# Patient Record
Sex: Male | Born: 1975 | Marital: Married | Smoking: Former smoker
Health system: Southern US, Community
[De-identification: ages and names within clinical notes are randomized; demographics above are authoritative.]

## PROBLEM LIST (undated history)

## (undated) DIAGNOSIS — J45909 Unspecified asthma, uncomplicated: Secondary | ICD-10-CM

## (undated) DIAGNOSIS — B351 Tinea unguium: Secondary | ICD-10-CM

## (undated) DIAGNOSIS — K319 Disease of stomach and duodenum, unspecified: Secondary | ICD-10-CM

## (undated) DIAGNOSIS — F429 Obsessive-compulsive disorder, unspecified: Secondary | ICD-10-CM

## (undated) DIAGNOSIS — R011 Cardiac murmur, unspecified: Secondary | ICD-10-CM

## (undated) DIAGNOSIS — G43909 Migraine, unspecified, not intractable, without status migrainosus: Secondary | ICD-10-CM

## (undated) HISTORY — DX: Tinea unguium: B35.1

## (undated) HISTORY — PX: OTHER SURGICAL HISTORY: SHX169

## (undated) HISTORY — DX: Obsessive-compulsive disorder, unspecified: F42.9

## (undated) HISTORY — DX: Disease of stomach and duodenum, unspecified: K31.9

## (undated) HISTORY — DX: Migraine, unspecified, not intractable, without status migrainosus: G43.909

## (undated) HISTORY — DX: Unspecified asthma, uncomplicated: J45.909

## (undated) HISTORY — DX: Cardiac murmur, unspecified: R01.1

---
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
## 2016-01-07 DIAGNOSIS — F422 Mixed obsessional thoughts and acts: Secondary | ICD-10-CM | POA: Diagnosis not present

## 2016-12-17 DIAGNOSIS — F458 Other somatoform disorders: Secondary | ICD-10-CM | POA: Diagnosis not present

## 2016-12-17 DIAGNOSIS — R5383 Other fatigue: Secondary | ICD-10-CM | POA: Diagnosis not present

## 2016-12-17 DIAGNOSIS — R002 Palpitations: Secondary | ICD-10-CM | POA: Diagnosis not present

## 2016-12-17 DIAGNOSIS — R413 Other amnesia: Secondary | ICD-10-CM | POA: Diagnosis not present

## 2016-12-25 ENCOUNTER — Other Ambulatory Visit (HOSPITAL_BASED_OUTPATIENT_CLINIC_OR_DEPARTMENT_OTHER): Payer: Self-pay

## 2016-12-25 DIAGNOSIS — G4710 Hypersomnia, unspecified: Secondary | ICD-10-CM

## 2016-12-25 DIAGNOSIS — F458 Other somatoform disorders: Secondary | ICD-10-CM

## 2016-12-25 DIAGNOSIS — R5383 Other fatigue: Secondary | ICD-10-CM

## 2017-01-05 ENCOUNTER — Ambulatory Visit (HOSPITAL_BASED_OUTPATIENT_CLINIC_OR_DEPARTMENT_OTHER): Payer: BLUE CROSS/BLUE SHIELD | Attending: Internal Medicine | Admitting: Internal Medicine

## 2017-01-05 VITALS — Ht 72.0 in | Wt 180.0 lb

## 2017-01-05 DIAGNOSIS — G4719 Other hypersomnia: Secondary | ICD-10-CM | POA: Insufficient documentation

## 2017-01-05 DIAGNOSIS — G4710 Hypersomnia, unspecified: Secondary | ICD-10-CM

## 2017-01-05 DIAGNOSIS — Z79899 Other long term (current) drug therapy: Secondary | ICD-10-CM | POA: Insufficient documentation

## 2017-01-05 DIAGNOSIS — R5383 Other fatigue: Secondary | ICD-10-CM | POA: Diagnosis not present

## 2017-01-05 DIAGNOSIS — G4763 Sleep related bruxism: Secondary | ICD-10-CM | POA: Diagnosis not present

## 2017-01-05 DIAGNOSIS — F458 Other somatoform disorders: Secondary | ICD-10-CM

## 2017-01-16 DIAGNOSIS — G4710 Hypersomnia, unspecified: Secondary | ICD-10-CM | POA: Diagnosis not present

## 2017-01-16 NOTE — Procedures (Signed)
   NAME: Brad Huff DATE OF BIRTH:  Apr 07, 1976 MEDICAL RECORD NUMBER [id number]  LOCATION: [facility]  PHYSICIAN: Benjaman Kindler  DATE OF STUDY: 01/05/2017  SLEEP STUDY TYPE: Nocturnal Polysomnogram               REFERRING PHYSICIAN: Deretha Emory, MD   HEIGHT: 6' (182.9 cm)  WEIGHT: 180 lb (81.6 kg)    Body mass index is 24.41 kg/m.    CLINICAL INFORMATION The patient was referred to the sleep center for evaluation of  bruxism, excessive daytime sleepiness, FH of OSA.   MEDICATIONS Patient self administered medications include: PAXIL. Medications administered during study include No sleep medicine administered.Marland Kitchen  SLEEP STUDY TECHNIQUE A multi-channel overnight Polysomnography study was performed. The channels recorded and monitored were central and occipital EEG, electrooculogram (EOG), submentalis EMG (chin), nasal and oral airflow, thoracic and abdominal wall motion, anterior tibialis EMG, snore microphone, electrocardiogram, and a pulse oximetry.  TECHNICAL COMMENTS Comments added by Technician: None  Comments added by Scorer: N/A  SLEEP ARCHITECTURE The study was initiated at 9:15:46 PM and terminated at 4:45:25 AM. The total recorded time was 449.6 minutes. EEG confirmed total sleep time was 416.7 minutes yielding a sleep efficiency of 92.7%. Sleep onset after lights out was 14.5 minutes with a REM latency of 96.5 minutes. The patient spent 5.76% of the night in stage N1 sleep, 72.16% in stage N2 sleep, 0.00% in stage N3 and 22.08% in REM. Wake after sleep onset (WASO) was 18.5 minutes. The Arousal Index was 15.4/hour.  RESPIRATORY PARAMETERS There were a total of 3 respiratory disturbances out of which 3 were apneas ( 0 obstructive, 0 mixed, 3 central) and 0 hypopneas. The apnea/hypopnea index (AHI) was 0.4 events/hour. The RDI was 0.2/hr. The central sleep apnea index was 0.4 events/hour. The REM AHI was 2.0 events/hour and NREM AHI was 0.0  events/hour. The supine AHI was 0.6 events/hour and the non supine AHI was 0.00 supine during 75.50% of sleep. Respiratory disturbances were associated with oxygen desaturation down to a nadir of 92.00% during sleep. The mean oxygen saturation during the study was 96.56%. The cumulative time under 88% oxygen saturation was 0.0 minutes.  LEG MOVEMENT DATA The total leg movements were with a resulting leg movement index of 5.3/hr . Associated arousal with leg movement index was 0.3/hr.  CARDIAC DATA The underlying cardiac rhythm was most consistent with sinus rhythm. Mean heart rate during sleep was 50.99 bpm. Additional rhythm abnormalities include None.  IMPRESSIONS - There is no evidence of significant sleep disordered breathing. - Few LMs are seen, virtually none with arousals  DIAGNOSIS - Normal study  RECOMMENDATIONS - There is no indication for the treatment of sleep disordered breathing  Benjaman Kindler Diplomate, American Board of Sleep Medicine  ELECTRONICALLY SIGNED ON:  01/16/2017, 9:47 PM [facility] PH: [phone]   FX: [phone] ACCREDITED BY THE AMERICAN ACADEMY OF SLEEP MEDICINE

## 2017-01-22 DIAGNOSIS — F422 Mixed obsessional thoughts and acts: Secondary | ICD-10-CM | POA: Diagnosis not present

## 2017-03-03 ENCOUNTER — Telehealth: Payer: Self-pay | Admitting: Cardiovascular Disease

## 2017-03-03 NOTE — Telephone Encounter (Signed)
Received records from [facility] for appointment on 03/19/17 with Dr Allyson Sabal.  Records put with Dr Hazle Coca schedule for 03/19/17. lp

## 2017-03-19 ENCOUNTER — Encounter (INDEPENDENT_AMBULATORY_CARE_PROVIDER_SITE_OTHER): Payer: Self-pay

## 2017-03-19 ENCOUNTER — Encounter: Payer: Self-pay | Admitting: Cardiovascular Disease

## 2017-03-19 ENCOUNTER — Ambulatory Visit (INDEPENDENT_AMBULATORY_CARE_PROVIDER_SITE_OTHER): Payer: BLUE CROSS/BLUE SHIELD | Admitting: Cardiovascular Disease

## 2017-03-19 DIAGNOSIS — Z8249 Family history of ischemic heart disease and other diseases of the circulatory system: Secondary | ICD-10-CM | POA: Insufficient documentation

## 2017-03-19 NOTE — Patient Instructions (Signed)
Medication Instructions: Your physician recommends that you continue on your current medications as directed. Please refer to the Current Medication list given to you today.  Testing/Procedures: Your physician has requested that you have cardiac CT (Coronary Calcium Score). Cardiac computed tomography (CT) is a painless test that uses an x-ray machine to take clear, detailed pictures of your heart. For further information please visit [email or web address]. Please follow instruction sheet as given.   Follow-Up: Your physician recommends that you schedule a follow-up appointment as needed with Dr. Allyson SabalBerry.

## 2017-03-19 NOTE — Addendum Note (Signed)
Addended by: Chana BodeGREEN, Mischa Brittingham L on: 03/19/2017 04:09 PM   Modules accepted: Orders

## 2017-03-19 NOTE — Assessment & Plan Note (Signed)
Mr. Brad Huff is a 41 year old young healthy-appearing Caucasian male referred by Dr. Nedra HaiLee for cardiovascular evaluation because of a family history of heart disease. He otherwise has no risk factors. He does exercise without limitation. His father apparently died at age 41 of a myocardial infarction. I am going to get coronary artery calcium score to risk stratify him.

## 2017-03-19 NOTE — Progress Notes (Signed)
     03/19/2017 Brad Huff Brad Huff   May 29, 1976  [id number]  Primary Physician Lenell AntuLe, Thao P, DO Primary Cardiologist: Runell GessJonathan J Malaka Ruffner MD Roseanne RenoFACP, FACC, FAHA, FSCAI  HPI:  Brad Huff Brad Huff is a 41 y.o. fit-appearing married Caucasian male father of one child referred by Dr. Conley RollsLe for cardiovascular evaluation because of family history. He has no other cardiac risk factors. He is completely asymptomatic. Never had a heart attack or stroke. He does not smoke. He works as a Production designer, theatre/television/filmmanager at [employer]. Does drink alcohol 3 nights a week.   Current Meds  Medication Sig  . PARoxetine (PAXIL) 20 MG tablet Take 20 mg by mouth daily.     Not on File  Social History   Social History  . Marital status: Married    Spouse name: N/A  . Number of children: N/A  . Years of education: N/A   Occupational History  . Not on file.   Social History Main Topics  . Smoking status: Never Smoker  . Smokeless tobacco: Never Used  . Alcohol use Not on file  . Drug use: Unknown  . Sexual activity: Not on file   Other Topics Concern  . Not on file   Social History Narrative  . No narrative on file     Review of Systems: General: negative for chills, fever, night sweats or weight changes.  Cardiovascular: negative for chest pain, dyspnea on exertion, edema, orthopnea, palpitations, paroxysmal nocturnal dyspnea or shortness of breath Dermatological: negative for rash Respiratory: negative for cough or wheezing Urologic: negative for hematuria Abdominal: negative for nausea, vomiting, diarrhea, bright red blood per rectum, melena, or hematemesis Neurologic: negative for visual changes, syncope, or dizziness All other systems reviewed and are otherwise negative except as noted above.    Blood pressure 116/76, pulse 71, height 6' (1.829 m), weight 176 lb 12.8 oz (80.2 kg).  General appearance: alert and no distress Neck: no adenopathy, no carotid bruit, no JVD, supple, symmetrical, trachea midline and  thyroid not enlarged, symmetric, no tenderness/mass/nodules Lungs: clear to auscultation bilaterally Heart: regular rate and rhythm, S1, S2 normal, no murmur, click, rub or gallop Extremities: extremities normal, atraumatic, no cyanosis or edema Pulses: 2+ and symmetric Skin: Skin color, texture, turgor normal. No rashes or lesions Neurologic: Alert and oriented X 3, normal strength and tone. Normal symmetric reflexes. Normal coordination and gait  EKG sinus rhythm at 71 with incomplete right bundle branch block. I personally reviewed this EKG.  ASSESSMENT AND PLAN:   Family history of heart disease Brad Huff is a 41 year old young healthy-appearing Caucasian male referred by Dr. Nedra HaiLee for cardiovascular evaluation because of a family history of heart disease. He otherwise has no risk factors. He does exercise without limitation. His father apparently died at age 41 of a myocardial infarction. I am going to get coronary artery calcium score to risk stratify him.      Runell GessJonathan J. Alexander Aument MD FACP,FACC,FAHA, [facility] 03/19/2017 3:53 PM

## 2017-03-23 ENCOUNTER — Telehealth: Payer: Self-pay | Admitting: Cardiovascular Disease

## 2017-03-23 NOTE — Telephone Encounter (Signed)
Left message for patient letting him know his Calcium Sc[facility] is [facility]  I also let him know that he will need to pay $150.00 at the time of the visit.

## 2017-03-26 ENCOUNTER — Inpatient Hospital Stay: Admission: RE | Admit: 2017-03-26 | Payer: BLUE CROSS/BLUE SHIELD | Source: Ambulatory Visit

## 2017-04-02 ENCOUNTER — Inpatient Hospital Stay: Admission: RE | Admit: 2017-04-02 | Payer: BLUE CROSS/BLUE SHIELD | Source: Ambulatory Visit

## 2017-05-18 ENCOUNTER — Ambulatory Visit (HOSPITAL_COMMUNITY): Payer: BLUE CROSS/BLUE SHIELD | Admitting: Psychiatry

## 2017-05-18 ENCOUNTER — Encounter (HOSPITAL_COMMUNITY): Payer: Self-pay

## 2017-05-21 DIAGNOSIS — F422 Mixed obsessional thoughts and acts: Secondary | ICD-10-CM | POA: Diagnosis not present

## 2017-05-21 DIAGNOSIS — F410 Panic disorder [episodic paroxysmal anxiety] without agoraphobia: Secondary | ICD-10-CM | POA: Diagnosis not present

## 2017-06-10 ENCOUNTER — Ambulatory Visit (HOSPITAL_COMMUNITY): Payer: Self-pay | Admitting: Licensed Clinical Social Worker

## 2017-07-29 DIAGNOSIS — F422 Mixed obsessional thoughts and acts: Secondary | ICD-10-CM | POA: Diagnosis not present

## 2017-12-10 DIAGNOSIS — F422 Mixed obsessional thoughts and acts: Secondary | ICD-10-CM | POA: Diagnosis not present

## 2018-01-07 DIAGNOSIS — F422 Mixed obsessional thoughts and acts: Secondary | ICD-10-CM | POA: Diagnosis not present

## 2018-02-01 DIAGNOSIS — F422 Mixed obsessional thoughts and acts: Secondary | ICD-10-CM | POA: Diagnosis not present

## 2018-03-15 DIAGNOSIS — F422 Mixed obsessional thoughts and acts: Secondary | ICD-10-CM | POA: Diagnosis not present

## 2018-06-03 DIAGNOSIS — F422 Mixed obsessional thoughts and acts: Secondary | ICD-10-CM | POA: Diagnosis not present

## 2018-07-01 DIAGNOSIS — F422 Mixed obsessional thoughts and acts: Secondary | ICD-10-CM | POA: Diagnosis not present

## 2018-07-22 ENCOUNTER — Ambulatory Visit (INDEPENDENT_AMBULATORY_CARE_PROVIDER_SITE_OTHER): Payer: BLUE CROSS/BLUE SHIELD | Admitting: Cardiovascular Disease

## 2018-07-22 ENCOUNTER — Encounter: Payer: Self-pay | Admitting: Cardiovascular Disease

## 2018-07-22 DIAGNOSIS — R002 Palpitations: Secondary | ICD-10-CM

## 2018-07-22 DIAGNOSIS — Z8249 Family history of ischemic heart disease and other diseases of the circulatory system: Secondary | ICD-10-CM | POA: Diagnosis not present

## 2018-07-22 NOTE — Assessment & Plan Note (Signed)
Brad Huff is noted increasing palpitations of the last several weeks to months that now occur on a daily basis.  He is under a lot of stress at work and drinks a pot of coffee a day.  We are going to get a 2D echo, 2-week ZIO patch and thyroid function test.

## 2018-07-22 NOTE — Assessment & Plan Note (Signed)
I am going to get a coronary calcium score to further evaluate given his family history of heart disease.

## 2018-07-22 NOTE — Patient Instructions (Addendum)
Medication Instructions:  Your physician recommends that you continue on your current medications as directed. Please refer to the Current Medication list given to you today.  If you need a refill on your cardiac medications before your next appointment, please call your pharmacy.   Lab work: Your physician recommends that you return for lab work today: LIPID AND LIVER PANELS; TSH  If you have labs (blood work) drawn today and your tests are completely normal, you will receive your results only by: Marland Kitchen MyChart Message (if you have MyChart) OR . A paper copy in the mail If you have any lab test that is abnormal or we need to change your treatment, we will call you to review the results.  Testing/Procedures: Your physician has requested that you have an echocardiogram. Echocardiography is a painless test that uses sound waves to create images of your heart. It provides your doctor with information about the size and shape of your heart and how well your heart's chambers and valves are working. This procedure takes approximately one hour. There are no restrictions for this procedure. This will be performed at our [facility] location - [address], [address].     Your physician has recommended that you wear a 14 DAY ZIO-PATCH monitor. The Zio patch cardiac monitor continuously records heart rhythm data for up to 14 days, this is for patients being evaluated for multiple types heart rhythms. For the first 24 hours post application, please avoid getting the Zio monitor wet in the shower or by excessive sweating during exercise. After that, feel free to carry on with regular activities. Keep soaps and lotions away from the ZIO XT Patch.  This will be placed at our [facility] location - [address].        Your physician recommends that you have a coronary calcium scan.  Coronary Calcium Scan A coronary calcium scan is an imaging test used to look for deposits of calcium and other  fatty materials (plaques) in the inner lining of the blood vessels of the heart (coronary arteries). These deposits of calcium and plaques can partly clog and narrow the coronary arteries without producing any symptoms or warning signs. This puts a person at risk for a heart attack. This test can detect these deposits before symptoms develop. Tell a health care provider about:  Any allergies you have.  All medicines you are taking, including vitamins, herbs, eye drops, creams, and over-the-counter medicines.  Any problems you or family members have had with anesthetic medicines.  Any blood disorders you have.  Any surgeries you have had.  Any medical conditions you have.  Whether you are pregnant or may be pregnant. What are the risks? Generally, this is a safe procedure. However, problems may occur, including:  Harm to a pregnant woman and her unborn baby. This test involves the use of radiation. Radiation exposure can be dangerous to a pregnant woman and her unborn baby. If you are pregnant, you generally should not have this procedure done.  Slight increase in the risk of cancer. This is because of the radiation involved in the test. What happens before the procedure? No preparation is needed for this procedure. What happens during the procedure?   You will undress and remove any jewelry around your neck or chest.  You will put on a hospital gown.  Sticky electrodes will be placed on your chest. The electrodes will be connected to an electrocardiogram (ECG) machine to record  a tracing of the electrical activity of your heart.  A CT scanner will take pictures of your heart. During this time, you will be asked to lie still and hold your breath for 2-3 seconds while a picture of your heart is being taken. The procedure may vary among health care providers and hospitals. What happens after the procedure?  You can get dressed.  You can return to your normal activities.  It is  up to you to get the results of your test. Ask your health care provider, or the department that is doing the test, when your results will be ready. Summary  A coronary calcium scan is an imaging test used to look for deposits of calcium and other fatty materials (plaques) in the inner lining of the blood vessels of the heart (coronary arteries).  Generally, this is a safe procedure. Tell your health care provider if you are pregnant or may be pregnant.  No preparation is needed for this procedure.  A CT scanner will take pictures of your heart.  You can return to your normal activities after the scan is done. This information is not intended to replace advice given to you by your health care provider. Make sure you discuss any questions you have with your health care provider. Document Released: 11/14/2007 Document Revised: 04/06/2016 Document Reviewed: 04/06/2016 Elsevier Interactive Patient Education  2019 [employer].   Follow-Up: At [facility], you and your health needs are our priority.  As part of our continuing mission to provide you with exceptional heart care, we have created designated Provider Care Teams.  These Care Teams include your primary Cardiologist (physician) and Advanced Practice Providers (APPs -  Physician Assistants and Nurse Practitioners) who all work together to provide you with the care you need, when you need it. . You will need a follow up appointment in 3 months. You may see Dr. Allyson Sabal or one of the following Advanced Practice Providers on your designated Care Team:   . Corine Shelter, [place] . Azalee Course, PA-C . Micah Flesher, PA-C . Joni Reining, DNP . Theodore Demark, PA-C . Judy Pimple, PA-C . Marjie Skiff, PA-C

## 2018-07-22 NOTE — Progress Notes (Signed)
07/22/2018 Brad Huff   1975/09/01  [id number]  Primary Physician Brad Antu, DO Primary Cardiologist: Brad Gess MD Brad Huff  HPI:  Brad Huff is a 43 y.o.  fit-appearing married Caucasian male father of one child referred by Brad Huff for cardiovascular evaluation because of family history. He has no other cardiac risk factors. He is completely asymptomatic. Never had a heart attack or stroke. He does not smoke. 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  Does drink alcohol 3 nights a week.  Since I saw him in the office a year and a half ago he is noticed significant increase stress in his life is a relates to his business.  This was coincident with increase in his palpitations. He also drinks a pot of coffee a day.  He denies chest pain or shortness of breath.    Current Meds  Medication Sig  . PARoxetine (PAXIL) 40 MG tablet Take 40 mg by mouth every morning.     Not on File  Social History   Socioeconomic History  . Marital status: Married    Spouse name: Not on file  . Number of children: Not on file  . Years of education: Not on file  . Highest education level: Not on file  Occupational History  . Not on file  Social Needs  . Financial resource strain: Not on file  . Food insecurity:    Worry: Not on file    Inability: Not on file  . Transportation needs:    Medical: Not on file    Non-medical: Not on file  Tobacco Use  . Smoking status: Never Smoker  . Smokeless tobacco: Never Used  Substance and Sexual Activity  . Alcohol use: Not on file  . Drug use: Not on file  . Sexual activity: Not on file  Lifestyle  . Physical activity:    Days per week: Not on file    Minutes per session: Not on file  . Stress: Not on file  Relationships  . Social connections:    Talks on phone: Not on file    Gets together: Not on file    Attends religious service: Not on file    Active member  of club or organization: Not on file    Attends meetings of clubs or organizations: Not on file    Relationship status: Not on file  . Intimate partner violence:    Fear of current or ex partner: Not on file    Emotionally abused: Not on file    Physically abused: Not on file    Forced sexual activity: Not on file  Other Topics Concern  . Not on file  Social History Narrative  . Not on file     Review of Systems: General: negative for chills, fever, night sweats or weight changes.  Cardiovascular: negative for chest pain, dyspnea on exertion, edema, orthopnea, palpitations, paroxysmal nocturnal dyspnea or shortness of breath Dermatological: negative for rash Respiratory: negative for cough or wheezing Urologic: negative for hematuria Abdominal: negative for nausea, vomiting, diarrhea, bright red blood per rectum, melena, or hematemesis Neurologic: negative for visual changes, syncope, or dizziness All other systems reviewed and are otherwise negative except as noted above.    Blood pressure 126/84, pulse 61, height 6' (1.829 m), weight 191 lb (86.6 kg).  General appearance: alert and no distress Neck: no adenopathy, no carotid bruit,  no JVD, supple, symmetrical, trachea midline and thyroid not enlarged, symmetric, no tenderness/mass/nodules Lungs: clear to auscultation bilaterally Heart: regular rate and rhythm, S1, S2 normal, no murmur, click, rub or gallop Extremities: extremities normal, atraumatic, no cyanosis or edema Pulses: 2+ and symmetric Skin: Skin color, texture, turgor normal. No rashes or lesions Neurologic: Alert and oriented X 3, normal strength and tone. Normal symmetric reflexes. Normal coordination and gait  EKG sinus rhythm at 61 with a right bundle branch block.  I personally reviewed this EKG.  ASSESSMENT AND PLAN:   Palpitations Brad Huff is noted increasing palpitations of the last several weeks to months that now occur on a daily basis.  He is under a  lot of stress at work and drinks a pot of coffee a day.  We are going to get a 2D echo, 2-week ZIO patch and thyroid function test.  Family history of heart disease I am going to get a coronary calcium score to further evaluate given his family history of heart disease.      Brad Gess MD FACP,FACC,FAHA, [facility] 07/22/2018 10:55 AM

## 2018-08-03 ENCOUNTER — Encounter: Payer: Self-pay | Admitting: *Deleted

## 2018-08-03 ENCOUNTER — Ambulatory Visit (INDEPENDENT_AMBULATORY_CARE_PROVIDER_SITE_OTHER): Payer: BLUE CROSS/BLUE SHIELD

## 2018-08-03 ENCOUNTER — Ambulatory Visit (HOSPITAL_COMMUNITY): Payer: BLUE CROSS/BLUE SHIELD | Attending: Cardiology

## 2018-08-03 ENCOUNTER — Ambulatory Visit (INDEPENDENT_AMBULATORY_CARE_PROVIDER_SITE_OTHER)
Admission: RE | Admit: 2018-08-03 | Discharge: 2018-08-03 | Disposition: A | Discharge status: Home / Self Care | Payer: Self-pay | Source: Ambulatory Visit | Attending: Cardiovascular Disease | Admitting: Cardiovascular Disease

## 2018-08-03 DIAGNOSIS — R002 Palpitations: Secondary | ICD-10-CM

## 2018-08-03 DIAGNOSIS — Z8249 Family history of ischemic heart disease and other diseases of the circulatory system: Secondary | ICD-10-CM

## 2018-08-03 NOTE — Progress Notes (Signed)
Patient ID: Brad Huff, male   DOB: 31-Dec-1975, 43 y.o.   MRN: [id number] Due to high deductible plan, patient requested quote on out of pocket cost for available 14 day long term holter monitors.  Pre[employer]ice $282.34, ZIO patch $525.00.  Patient opted for lower out of pocket option which will provide the same data.

## 2018-09-18 DIAGNOSIS — R05 Cough: Secondary | ICD-10-CM | POA: Diagnosis not present

## 2018-10-19 ENCOUNTER — Telehealth: Payer: Self-pay | Admitting: Cardiovascular Disease

## 2018-10-21 ENCOUNTER — Telehealth: Payer: Self-pay

## 2018-10-21 ENCOUNTER — Ambulatory Visit: Payer: BLUE CROSS/BLUE SHIELD | Admitting: Cardiovascular Disease

## 2018-10-21 NOTE — Telephone Encounter (Signed)
lmtcb by 0900 to change 1000 office visit to virtual or to reschedule for OV in 3 mos.

## 2018-12-01 NOTE — Telephone Encounter (Signed)
Open n error °

## 2018-12-16 DIAGNOSIS — F422 Mixed obsessional thoughts and acts: Secondary | ICD-10-CM | POA: Diagnosis not present

## 2019-01-20 DIAGNOSIS — Z0000 Encounter for general adult medical examination without abnormal findings: Secondary | ICD-10-CM | POA: Diagnosis not present

## 2019-01-20 DIAGNOSIS — Z13220 Encounter for screening for lipoid disorders: Secondary | ICD-10-CM | POA: Diagnosis not present

## 2019-02-17 DIAGNOSIS — R6882 Decreased libido: Secondary | ICD-10-CM | POA: Diagnosis not present

## 2019-03-21 DIAGNOSIS — Z23 Encounter for immunization: Secondary | ICD-10-CM | POA: Diagnosis not present

## 2019-03-21 DIAGNOSIS — R6882 Decreased libido: Secondary | ICD-10-CM | POA: Diagnosis not present

## 2019-04-13 IMAGING — CT CT HEART SCORING
2 series · 16 of 20 positions shown, 18 images · non-contrast
Comparison: None
COMPARISON: None
COMPARISON: None

Addendum:
EXAM:
OVER-READ INTERPRETATION  CT CHEST

The following report is an over-read performed by radiologist Dr.
Lhaaj Trs [REDACTED] on 08/03/2018. This over-read
does not include interpretation of cardiac or coronary anatomy or
pathology. The coronary calcium score interpretation by the
cardiologist is attached.
CLINICAL DATA: Risk stratification
Coronary Calcium Score
TECHNIQUE: The patient was scanned on a Siemens Somatom 64 slice scanner. Axial
non-contrast 3 mm slices were carried out through the heart. The
data set was analyzed on a dedicated work station and scored using
the Agatson method.

[Series 3: casc 3.0 i36f 2 bestdiast 72 % · axial · 0.33mm/px · z∈[-291,-174]mm · 8 of 51 slices shown, 10 images]
[im 6/51  vessel]
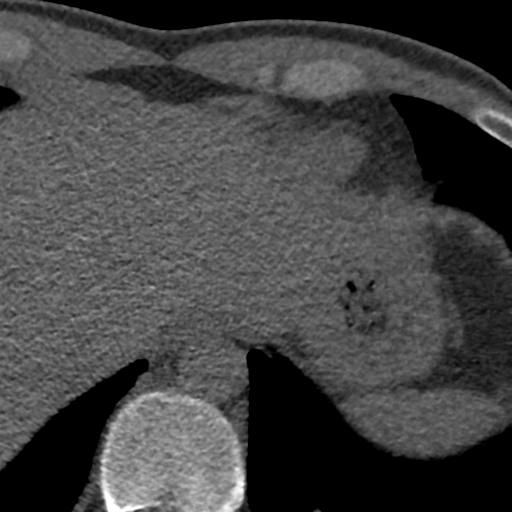
[im 6/51  lung]
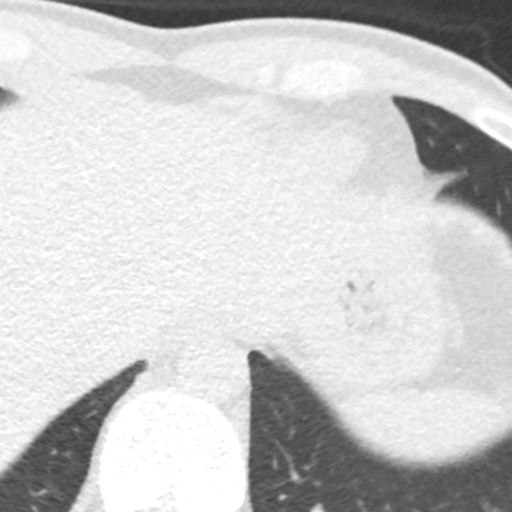
[im 12/51  vessel]
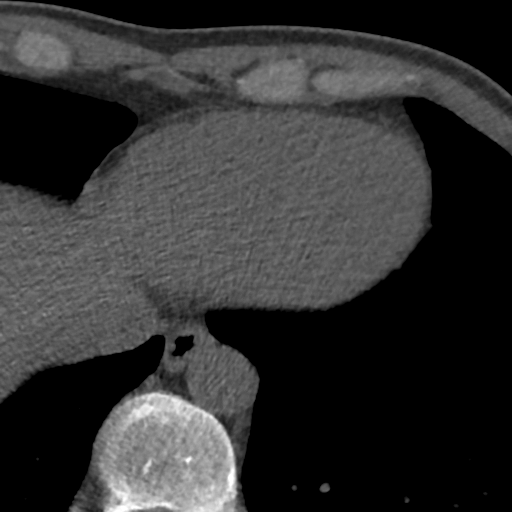
[im 17/51  vessel]
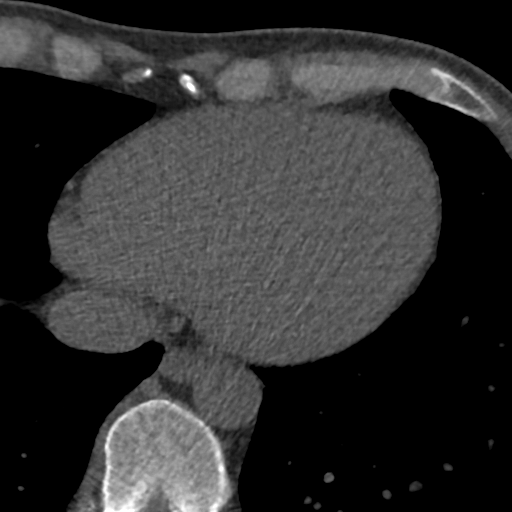
[im 23/51  vessel]
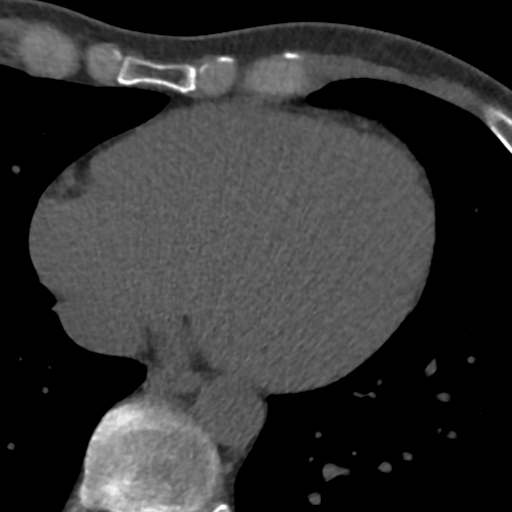
[im 28/51  vessel]
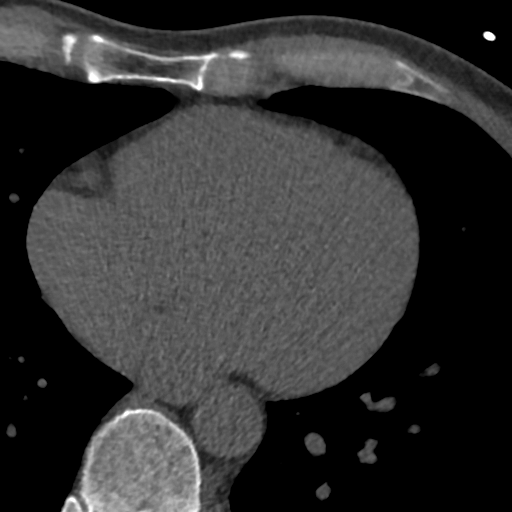
[im 28/51  lung]
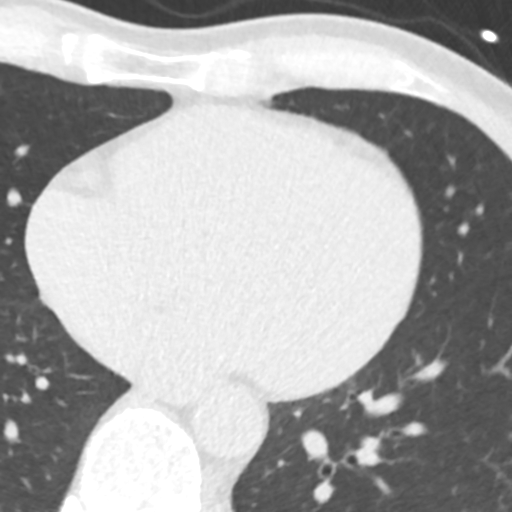
[im 34/51  vessel]
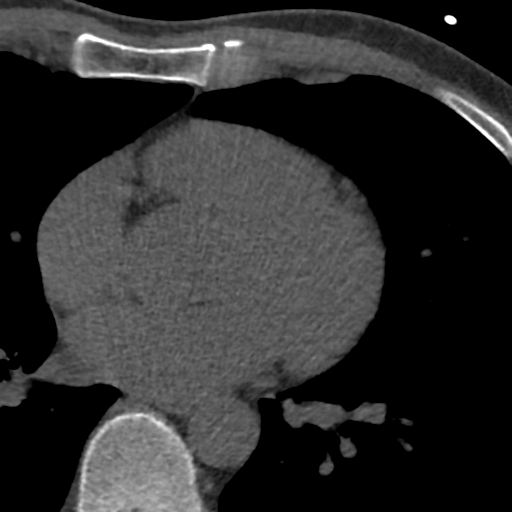
[im 39/51  vessel]
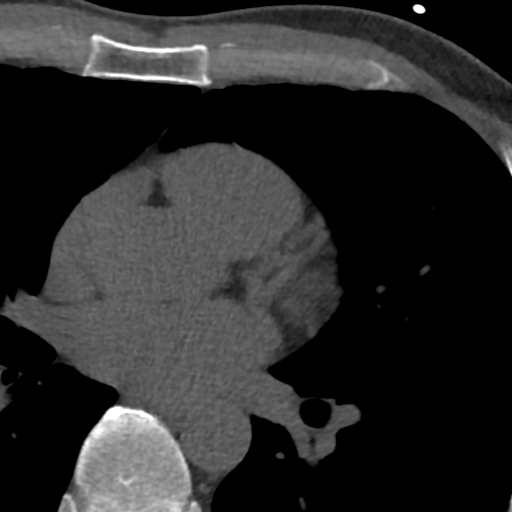
[im 45/51  vessel]
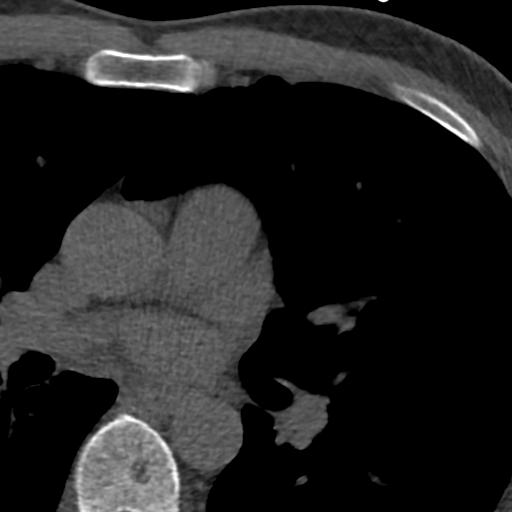

[Series 5: lung st 71 % · axial · 0.68mm/px · z∈[-291,-174]mm · 8 of 51 slices shown]
[im 6/51  lung]
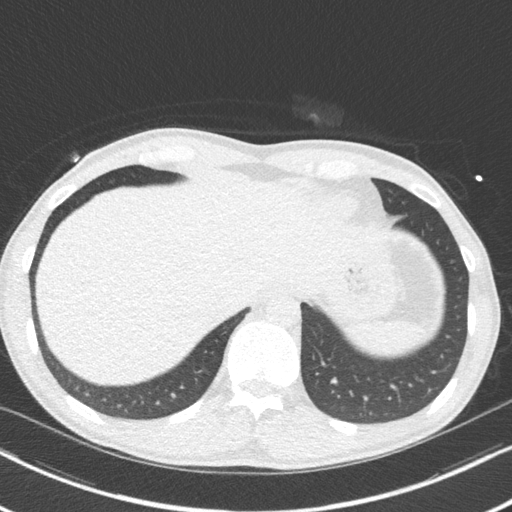
[im 12/51  lung]
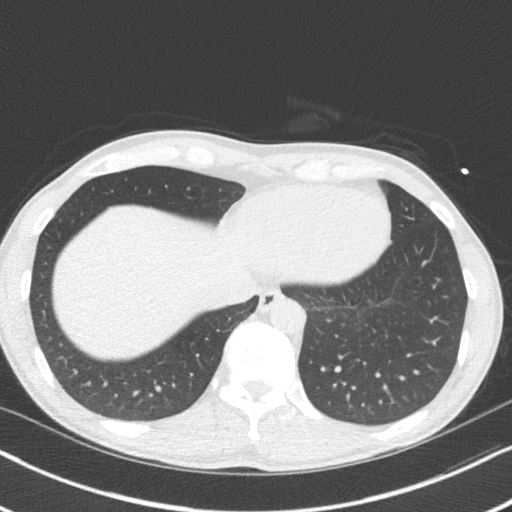
[im 17/51  lung]
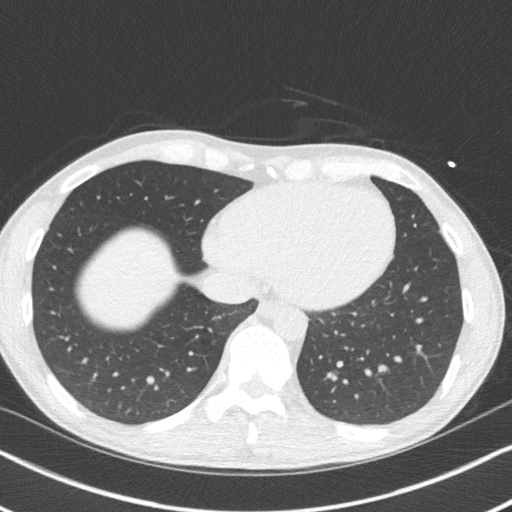
[im 23/51  lung]
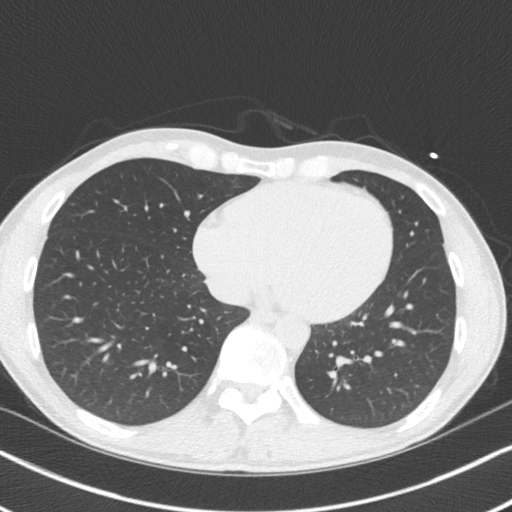
[im 28/51  lung]
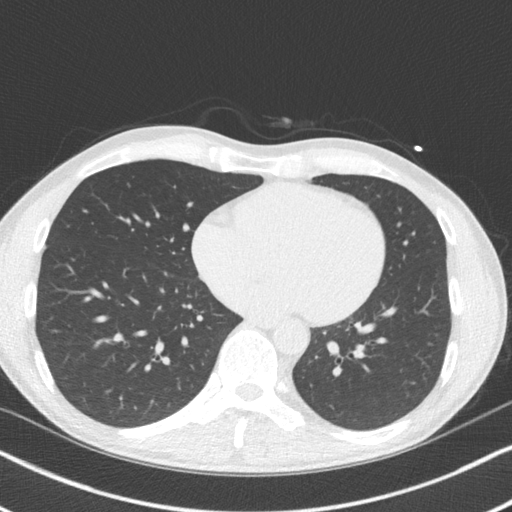
[im 34/51  lung]
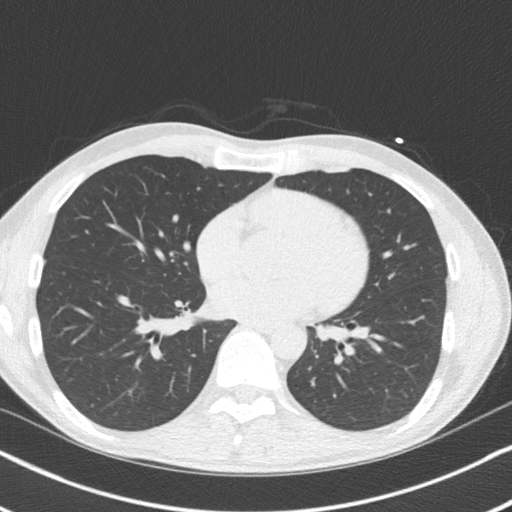
[im 39/51  lung]
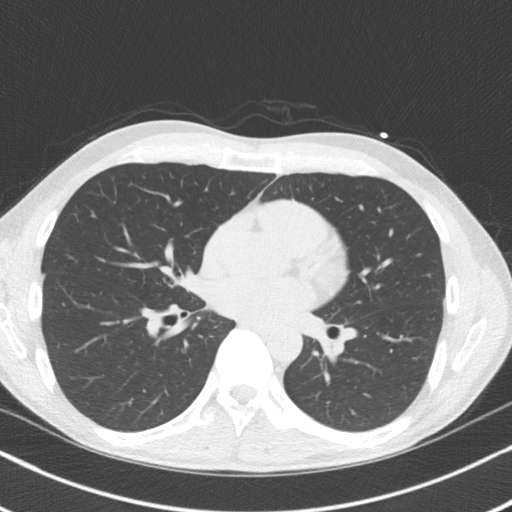
[im 45/51  lung]
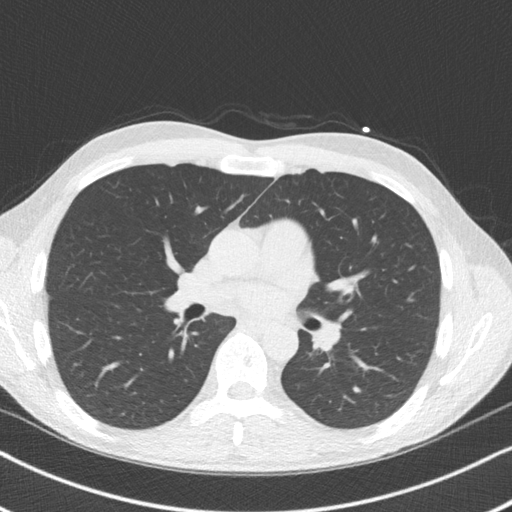

[16 of 20 positions shown; findings below may reference images not displayed]

FINDINGS: Vascular: Heart is normal size.  Visualized aorta normal caliber.

Mediastinum/Nodes: No adenopathy in the lower mediastinum or hila.

Lungs/Pleura: Visualized lungs clear.  No effusions.

Upper Abdomen: Imaging into the upper abdomen shows no acute
findings.

Musculoskeletal: Chest wall soft tissues are unremarkable. No acute
bony abnormality.
IMPRESSION: No acute or significant extracardiac abnormality.
FINDINGS: Non-cardiac: See separate report from [REDACTED].

Ascending aorta: Normal diameter 3.0 cm

Pericardium: Normal

Coronary arteries: No calcium noted
IMPRESSION: Coronary calcium score of 0.   .

Lakita Gaddy

*** End of Addendum ***
Addendum:
EXAM:
OVER-READ INTERPRETATION  CT CHEST

The following report is an over-read performed by radiologist Dr.
Lhaaj Trs [REDACTED] on 08/03/2018. This over-read
does not include interpretation of cardiac or coronary anatomy or
pathology. The coronary calcium score interpretation by the
cardiologist is attached.
FINDINGS: Vascular: Heart is normal size.  Visualized aorta normal caliber.

Mediastinum/Nodes: No adenopathy in the lower mediastinum or hila.

Lungs/Pleura: Visualized lungs clear.  No effusions.

Upper Abdomen: Imaging into the upper abdomen shows no acute
findings.

Musculoskeletal: Chest wall soft tissues are unremarkable. No acute
bony abnormality.
IMPRESSION: No acute or significant extracardiac abnormality.
FINDINGS: Non-cardiac: See separate report from [REDACTED].

Ascending aorta: Normal diameter 3.0 cm

Pericardium: Normal

Coronary arteries: No calcium noted
IMPRESSION: Coronary calcium score of 0.   .

Lakita Gaddy

*** End of Addendum ***
EXAM:
OVER-READ INTERPRETATION  CT CHEST

The following report is an over-read performed by radiologist Dr.
Lhaaj Trs [REDACTED] on 08/03/2018. This over-read
does not include interpretation of cardiac or coronary anatomy or
pathology. The coronary calcium score interpretation by the
cardiologist is attached.
FINDINGS: Vascular: Heart is normal size.  Visualized aorta normal caliber.

Mediastinum/Nodes: No adenopathy in the lower mediastinum or hila.

Lungs/Pleura: Visualized lungs clear.  No effusions.

Upper Abdomen: Imaging into the upper abdomen shows no acute
findings.

Musculoskeletal: Chest wall soft tissues are unremarkable. No acute
bony abnormality.
IMPRESSION: No acute or significant extracardiac abnormality.

## 2019-05-18 DIAGNOSIS — F422 Mixed obsessional thoughts and acts: Secondary | ICD-10-CM | POA: Diagnosis not present

## 2019-06-21 DIAGNOSIS — F4322 Adjustment disorder with anxiety: Secondary | ICD-10-CM | POA: Diagnosis not present

## 2019-07-25 DIAGNOSIS — F422 Mixed obsessional thoughts and acts: Secondary | ICD-10-CM | POA: Diagnosis not present

## 2019-08-03 DIAGNOSIS — F4322 Adjustment disorder with anxiety: Secondary | ICD-10-CM | POA: Diagnosis not present

## 2019-11-09 DIAGNOSIS — F4322 Adjustment disorder with anxiety: Secondary | ICD-10-CM | POA: Diagnosis not present

## 2019-12-08 DIAGNOSIS — K6289 Other specified diseases of anus and rectum: Secondary | ICD-10-CM | POA: Diagnosis not present

## 2019-12-08 DIAGNOSIS — G43109 Migraine with aura, not intractable, without status migrainosus: Secondary | ICD-10-CM | POA: Diagnosis not present

## 2019-12-09 DIAGNOSIS — S52572A Other intraarticular fracture of lower end of left radius, initial encounter for closed fracture: Secondary | ICD-10-CM | POA: Diagnosis not present

## 2019-12-09 DIAGNOSIS — M545 Low back pain: Secondary | ICD-10-CM | POA: Diagnosis not present

## 2019-12-09 DIAGNOSIS — S270XXA Traumatic pneumothorax, initial encounter: Secondary | ICD-10-CM | POA: Diagnosis not present

## 2019-12-09 DIAGNOSIS — M542 Cervicalgia: Secondary | ICD-10-CM | POA: Diagnosis not present

## 2019-12-09 DIAGNOSIS — Z20822 Contact with and (suspected) exposure to covid-19: Secondary | ICD-10-CM | POA: Diagnosis not present

## 2019-12-09 DIAGNOSIS — S12600A Unspecified displaced fracture of seventh cervical vertebra, initial encounter for closed fracture: Secondary | ICD-10-CM | POA: Diagnosis not present

## 2019-12-09 DIAGNOSIS — M25522 Pain in left elbow: Secondary | ICD-10-CM | POA: Diagnosis not present

## 2019-12-09 DIAGNOSIS — R55 Syncope and collapse: Secondary | ICD-10-CM | POA: Diagnosis not present

## 2019-12-09 DIAGNOSIS — K5900 Constipation, unspecified: Secondary | ICD-10-CM | POA: Diagnosis not present

## 2019-12-09 DIAGNOSIS — M79601 Pain in right arm: Secondary | ICD-10-CM | POA: Diagnosis not present

## 2019-12-09 DIAGNOSIS — S52592A Other fractures of lower end of left radius, initial encounter for closed fracture: Secondary | ICD-10-CM | POA: Diagnosis not present

## 2019-12-09 DIAGNOSIS — R109 Unspecified abdominal pain: Secondary | ICD-10-CM | POA: Diagnosis not present

## 2019-12-09 DIAGNOSIS — R079 Chest pain, unspecified: Secondary | ICD-10-CM | POA: Diagnosis not present

## 2019-12-09 DIAGNOSIS — R102 Pelvic and perineal pain: Secondary | ICD-10-CM | POA: Diagnosis not present

## 2019-12-09 DIAGNOSIS — Z041 Encounter for examination and observation following transport accident: Secondary | ICD-10-CM | POA: Diagnosis not present

## 2019-12-09 DIAGNOSIS — Y92410 Unspecified street and highway as the place of occurrence of the external cause: Secondary | ICD-10-CM | POA: Diagnosis not present

## 2019-12-09 DIAGNOSIS — M79642 Pain in left hand: Secondary | ICD-10-CM | POA: Diagnosis not present

## 2019-12-09 DIAGNOSIS — S52502A Unspecified fracture of the lower end of left radius, initial encounter for closed fracture: Secondary | ICD-10-CM | POA: Diagnosis not present

## 2019-12-09 DIAGNOSIS — F419 Anxiety disorder, unspecified: Secondary | ICD-10-CM | POA: Diagnosis not present

## 2019-12-09 DIAGNOSIS — S0990XA Unspecified injury of head, initial encounter: Secondary | ICD-10-CM | POA: Diagnosis not present

## 2019-12-09 DIAGNOSIS — M21932 Unspecified acquired deformity of left forearm: Secondary | ICD-10-CM | POA: Diagnosis not present

## 2019-12-09 DIAGNOSIS — R110 Nausea: Secondary | ICD-10-CM | POA: Diagnosis not present

## 2020-01-01 DIAGNOSIS — S52502D Unspecified fracture of the lower end of left radius, subsequent encounter for closed fracture with routine healing: Secondary | ICD-10-CM | POA: Diagnosis not present

## 2020-01-01 DIAGNOSIS — K6289 Other specified diseases of anus and rectum: Secondary | ICD-10-CM | POA: Diagnosis not present

## 2020-01-17 DIAGNOSIS — S12690A Other displaced fracture of seventh cervical vertebra, initial encounter for closed fracture: Secondary | ICD-10-CM | POA: Diagnosis not present

## 2020-01-17 DIAGNOSIS — X58XXXA Exposure to other specified factors, initial encounter: Secondary | ICD-10-CM | POA: Diagnosis not present

## 2020-01-17 DIAGNOSIS — S12600D Unspecified displaced fracture of seventh cervical vertebra, subsequent encounter for fracture with routine healing: Secondary | ICD-10-CM | POA: Diagnosis not present

## 2020-01-17 DIAGNOSIS — S12690D Other displaced fracture of seventh cervical vertebra, subsequent encounter for fracture with routine healing: Secondary | ICD-10-CM | POA: Diagnosis not present

## 2020-06-21 DIAGNOSIS — F422 Mixed obsessional thoughts and acts: Secondary | ICD-10-CM | POA: Diagnosis not present

## 2020-08-02 DIAGNOSIS — F4321 Adjustment disorder with depressed mood: Secondary | ICD-10-CM | POA: Diagnosis not present

## 2020-08-07 DIAGNOSIS — F422 Mixed obsessional thoughts and acts: Secondary | ICD-10-CM | POA: Diagnosis not present

## 2020-09-06 DIAGNOSIS — F422 Mixed obsessional thoughts and acts: Secondary | ICD-10-CM | POA: Diagnosis not present

## 2020-10-04 DIAGNOSIS — F422 Mixed obsessional thoughts and acts: Secondary | ICD-10-CM | POA: Diagnosis not present

## 2020-12-06 DIAGNOSIS — F4321 Adjustment disorder with depressed mood: Secondary | ICD-10-CM | POA: Diagnosis not present

## 2021-02-21 DIAGNOSIS — F4321 Adjustment disorder with depressed mood: Secondary | ICD-10-CM | POA: Diagnosis not present

## 2021-02-28 DIAGNOSIS — Z1211 Encounter for screening for malignant neoplasm of colon: Secondary | ICD-10-CM | POA: Diagnosis not present

## 2021-02-28 DIAGNOSIS — K6289 Other specified diseases of anus and rectum: Secondary | ICD-10-CM | POA: Diagnosis not present

## 2021-03-21 DIAGNOSIS — F4321 Adjustment disorder with depressed mood: Secondary | ICD-10-CM | POA: Diagnosis not present

## 2021-03-25 DIAGNOSIS — K641 Second degree hemorrhoids: Secondary | ICD-10-CM | POA: Diagnosis not present

## 2021-03-25 DIAGNOSIS — Z8719 Personal history of other diseases of the digestive system: Secondary | ICD-10-CM | POA: Diagnosis not present

## 2021-03-25 DIAGNOSIS — R152 Fecal urgency: Secondary | ICD-10-CM | POA: Diagnosis not present

## 2021-04-11 DIAGNOSIS — F422 Mixed obsessional thoughts and acts: Secondary | ICD-10-CM | POA: Diagnosis not present

## 2021-05-16 DIAGNOSIS — F4321 Adjustment disorder with depressed mood: Secondary | ICD-10-CM | POA: Diagnosis not present

## 2021-07-11 DIAGNOSIS — U071 COVID-19: Secondary | ICD-10-CM | POA: Diagnosis not present

## 2021-07-11 DIAGNOSIS — R52 Pain, unspecified: Secondary | ICD-10-CM | POA: Diagnosis not present

## 2021-07-11 DIAGNOSIS — R6883 Chills (without fever): Secondary | ICD-10-CM | POA: Diagnosis not present

## 2021-07-11 DIAGNOSIS — R059 Cough, unspecified: Secondary | ICD-10-CM | POA: Diagnosis not present

## 2021-08-15 DIAGNOSIS — F4321 Adjustment disorder with depressed mood: Secondary | ICD-10-CM | POA: Diagnosis not present

## 2021-09-05 DIAGNOSIS — F4321 Adjustment disorder with depressed mood: Secondary | ICD-10-CM | POA: Diagnosis not present

## 2021-09-17 DIAGNOSIS — S30860A Insect bite (nonvenomous) of lower back and pelvis, initial encounter: Secondary | ICD-10-CM | POA: Diagnosis not present

## 2021-09-17 DIAGNOSIS — L02212 Cutaneous abscess of back [any part, except buttock]: Secondary | ICD-10-CM | POA: Diagnosis not present

## 2021-09-17 DIAGNOSIS — S31825A Open bite of left buttock, initial encounter: Secondary | ICD-10-CM | POA: Diagnosis not present

## 2021-11-14 DIAGNOSIS — G43109 Migraine with aura, not intractable, without status migrainosus: Secondary | ICD-10-CM | POA: Diagnosis not present

## 2021-11-14 DIAGNOSIS — F422 Mixed obsessional thoughts and acts: Secondary | ICD-10-CM | POA: Diagnosis not present

## 2022-01-07 DIAGNOSIS — F419 Anxiety disorder, unspecified: Secondary | ICD-10-CM | POA: Diagnosis not present

## 2022-01-27 DIAGNOSIS — F419 Anxiety disorder, unspecified: Secondary | ICD-10-CM | POA: Diagnosis not present

## 2022-02-11 DIAGNOSIS — F419 Anxiety disorder, unspecified: Secondary | ICD-10-CM | POA: Diagnosis not present

## 2022-02-23 DIAGNOSIS — Z0000 Encounter for general adult medical examination without abnormal findings: Secondary | ICD-10-CM | POA: Diagnosis not present

## 2022-02-23 DIAGNOSIS — E782 Mixed hyperlipidemia: Secondary | ICD-10-CM | POA: Diagnosis not present

## 2022-02-23 DIAGNOSIS — Z125 Encounter for screening for malignant neoplasm of prostate: Secondary | ICD-10-CM | POA: Diagnosis not present

## 2022-04-08 DIAGNOSIS — B351 Tinea unguium: Secondary | ICD-10-CM | POA: Diagnosis not present

## 2022-04-08 DIAGNOSIS — G43109 Migraine with aura, not intractable, without status migrainosus: Secondary | ICD-10-CM | POA: Diagnosis not present

## 2022-04-08 DIAGNOSIS — F422 Mixed obsessional thoughts and acts: Secondary | ICD-10-CM | POA: Diagnosis not present

## 2022-05-05 ENCOUNTER — Encounter: Payer: Self-pay | Admitting: *Deleted

## 2022-05-06 ENCOUNTER — Ambulatory Visit (INDEPENDENT_AMBULATORY_CARE_PROVIDER_SITE_OTHER): Payer: BC Managed Care – PPO | Admitting: Neurology

## 2022-05-06 ENCOUNTER — Encounter: Payer: Self-pay | Admitting: Neurology

## 2022-05-06 ENCOUNTER — Telehealth: Payer: Self-pay | Admitting: Neurology

## 2022-05-06 VITALS — BP 123/78 | HR 84 | Ht 72.0 in | Wt 173.4 lb

## 2022-05-06 DIAGNOSIS — Z820 Family history of epilepsy and other diseases of the nervous system: Secondary | ICD-10-CM | POA: Diagnosis not present

## 2022-05-06 DIAGNOSIS — R419 Unspecified symptoms and signs involving cognitive functions and awareness: Secondary | ICD-10-CM

## 2022-05-06 DIAGNOSIS — R413 Other amnesia: Secondary | ICD-10-CM | POA: Diagnosis not present

## 2022-05-06 DIAGNOSIS — Z789 Other specified health status: Secondary | ICD-10-CM

## 2022-05-06 DIAGNOSIS — Z818 Family history of other mental and behavioral disorders: Secondary | ICD-10-CM

## 2022-05-06 NOTE — Progress Notes (Signed)
Subjective:    Patient ID: Brad Huff is a 46 y.o. male.  HPI    [name], MD, PhD [facility] [address].O. [place], [place] [address]  Dear Ruby Cola,  I saw your patient, Brad Huff, upon your kind request in my neurologic clinic today for initial consultation of his memory loss.  The patient is unaccompanied today.  As you know, Brad Huff is a 46 year old male with an underlying medical history of OCD, migraine headaches, childhood asthma, and history of heart murmur, who reports short-term memory problems for the past several years.  He believes he has short-term and long-term memory issues.  He feels foggy headed at times, he is not sure if he just does not pay enough attention or if he is absent-minded.  No history of ADHD or ADD as a child, has never been tested.  I reviewed your office note from 04/08/2022.  He reports a family history of Parkinson's disease affecting his father, he passed away in his 75s from a heart attack.  Patient also reports a history of Parkinson's disease in paternal aunt and maternal aunt and both had memory issues as well.  Mom is 65, no significant memory issues.  Paternal aunt lived to be 29s or 26s and paternal aunt died in her mid 81s.   Patient denies any motor symptoms in keeping with Parkinson's disease but had worried about it.  He has in particular, no tremors, no balance issues, no sudden onset of one-sided weakness or numbness or tingling or droopy face or slurring of speech.  He takes Maxalt as needed for migraines.  He quit smoking when he was 21 or 22, uses nicotine pouches occasionally.  He drinks alcohol less than 1/day, in the past 10 weeks he has not had any alcohol.  He does drink quite a bit of caffeine in the form of coffee and tea, about 10 cups/day.  He drinks about 48 ounces of water per day and about 16 ounces of almond milk per day. He works in [employer] research (medical). He had blood work through your office on  02/23/2022 and I reviewed the results.  CBC with differential showed a mildly below normal WBC at 2.4, RBC normal at 4.73, hemoglobin normal at 14.4, hematocrit 41.6.  Platelets 200, all others normal.  CMP showed glucose of 86, BUN 16, creatinine 0.97, sodium 139, potassium 4.4, alk phos 39, AST 15, ALT 11.  Lipid panel showed total cholesterol mildly elevated at 220, triglycerides 52, LDL elevated at 150.  His Past Medical History Is Significant For: Past Medical History:  Diagnosis Date   Childhood asthma    Heart murmur    Migraines    OCD (obsessive compulsive disorder)    Onychomycosis    bilateral big toes (mild)   Stomach problems     His Past Surgical History Is Significant For: Past Surgical History:  Procedure Laterality Date   broken neck/wrist      2021   [place]    His Family History Is Significant For: Family History  Problem Relation Age of Onset   Hypertension Mother    Coronary artery disease Father    Hypertension Father    Parkinson's disease Father    Parkinson's disease Maternal Aunt    Parkinson's disease Paternal Aunt    Coronary artery disease Paternal Grandfather     His Social History Is Significant For: Social History   Socioeconomic History   Marital status: Married  Spouse name: Not on file   Number of children: Not on file   Years of education: Not on file   Highest education level: Not on file  Occupational History   Not on file  Tobacco Use   Smoking status: Former    Types: Cigarettes   Smokeless tobacco: Never  Substance and Sexual Activity   Alcohol use: Not Currently   Drug use: Not on file   Sexual activity: Not on file  Other Topics Concern   Not on file  Social History Narrative   Not on file   Social Determinants of Health   Financial Resource Strain: Not on file  Food Insecurity: Not on file  Transportation Needs: Not on file  Physical Activity: Not on file  Stress: Not on file  Social Connections:  Not on file    His Allergies Are:  No Known Allergies:   His Current Medications Are:  Outpatient Encounter Medications as of 05/06/2022  Medication Sig   ALPRAZolam (XANAX) 0.25 MG tablet Take 0.25 mg by mouth at bedtime as needed for anxiety.   PARoxetine (PAXIL) 40 MG tablet Take 40 mg by mouth every morning.   rizatriptan (MAXALT) 10 MG tablet Take 10 mg by mouth as needed for migraine.   terbinafine (LAMISIL) 250 MG tablet Take 250 mg by mouth daily. (Patient not taking: Reported on 05/06/2022)   No facility-administered encounter medications on file as of 05/06/2022.  :   Review of Systems:  Out of a complete 14 point review of systems, all are reviewed and negative with the exception of these symptoms as listed below:   Review of Systems  Neurological:        Pt here for cognition declining. Pt states having some short term memory loss     Objective:  Neurological Exam  Physical Exam Physical Examination:   Vitals:   05/06/22 0824  BP: 123/78  Pulse: 84   General Examination: The patient is a very pleasant 46 y.o. male in no acute distress. He appears well-developed and well-nourished and very well groomed.   HEENT: Normocephalic, atraumatic, pupils are reactive, extraocular tracking is good without limitation to gaze excursion or nystagmus noted. Hearing is grossly intact. Face is symmetric with normal facial animation. Speech is clear with no dysarthria noted. There is no hypophonia. There is no lip, neck/head, jaw or voice tremor. Neck is supple with full range of passive and active motion. There are no carotid bruits on auscultation. Oropharynx exam reveals: mild mouth dryness, good dental hygiene and moderate airway crowding secondary to wider uvula.  Tongue protrudes centrally and palate elevates symmetrically.   Chest: Clear to auscultation without wheezing, rhonchi or crackles noted.  Heart: S1+S2+0, regular and normal without murmurs, rubs or gallops noted.    Abdomen: Soft, non-tender and non-distended.  Extremities: There is no pitting edema in the distal lower extremities bilaterally.   Skin: Warm and dry without trophic changes noted.   Musculoskeletal: exam reveals no obvious joint deformities.   Neurologically:  Mental status: The patient is awake, alert and oriented in all 4 spheres. His immediate and remote memory, attention, language skills and fund of knowledge are appropriate. There is no evidence of aphasia, agnosia, apraxia or anomia. Speech is clear with normal prosody and enunciation. Thought process is linear. Mood is normal and affect is normal.      05/06/2022    8:29 AM  Montreal Cognitive Assessment   Visuospatial/ Executive (0/5) 4  Naming (0/3) 3  Attention: Read list of digits (0/2) 2  Attention: Read list of letters (0/1) 1  Attention: Serial 7 subtraction starting at 100 (0/3) 3  Language: Repeat phrase (0/2) 2  Language : Fluency (0/1) 1  Abstraction (0/2) 2  Delayed Recall (0/5) 5  Orientation (0/6) 6  Total 29   Cranial nerves II - XII are as described above under HEENT exam.  Motor exam: Normal bulk, strength and tone is noted. There is no obvious action or resting tremor.  Reflexes are 1-2+ throughout, toes are downgoing bilaterally. Fine motor skills and coordination: Normal finger taps, hand movements and rapid alternating patting in both upper extremities, normal symptoms bilaterally, no lateralization, no decrement in amplitude.    Cerebellar testing: No dysmetria or intention tremor. There is no truncal or gait ataxia.  Sensory exam: intact to light touch in the upper and lower extremities.  Gait, station and balance: He stands easily. No veering to one side is noted. No leaning to one side is noted. Posture is age-appropriate and stance is narrow based. Gait shows normal stride length and normal pace. No problems turning are noted.   Romberg is negative, tandem walk normal.  Assessment and Plan:   In summary, Brad Huff is a very pleasant 46 y.o.-year old male with an underlying medical history of OCD, migraine headaches, childhood asthma, and history of heart murmur, who presents for evaluation of his memory concerns with short-term and long-term memory concerns for the past several years.  He reports a family history of dementia as well as family history of parkinsonism.  He is largely reassured today.  Memory testing with MoCA examination was benign.  Physical examination and neurological examination in particular are benign.  No evidence of parkinsonism.  I recommend additional testing particularly for patient's reassurance.  We will proceed with a brain MRI, additional blood work through our office including thyroid function test and B12 level test and we will keep him posted as to his test results by phone call.  For more in-depth memory evaluation I recommend a referral to neuropsychology.  I placed a referral as he was agreeable.  We talked about the importance of keeping a healthy lifestyle.  He is advised to scale back on his caffeine consumption.  He is advised to stay well-hydrated.  He is advised to follow-up with your office and his current providers, we will follow-up as needed in this office and keep you posted as to his test results by phone call for now.   He is encouraged to talk to his psychologist about testing for ADD or ADHD.  Thank you very much for allowing me to participate in the care of this nice patient. If I can be of any further assistance to you please do not hesitate to call me at [phone].  Sincerely,   [name], MD, PhD

## 2022-05-06 NOTE — Patient Instructions (Addendum)
You have complaints of memory loss: memory loss or changes in cognitive function can have many reasons and does not always mean you have dementia. Conditions that can contribute to subjective or objective memory loss include: depression, stress, poor sleep from insomnia or sleep apnea, dehydration, fluctuation in blood sugar values, thyroid or electrolyte dysfunction and certain vitamin deficiencies. Dementia can be caused by stroke, brain atherosclerosis or brain vascular disease due to vascular risk factors (smoking, high blood pressure, high cholesterol, obesity and uncontrolled diabetes), certain degenerative brain disorders (including Parkinson's disease and Multiple sclerosis) and by Alzheimer's disease or other, more rare and sometimes hereditary causes. We will do some additional testing:   blood work (some of which has been done recently already by your PCP)  We will do a brain scan, called MRI and call you with the test results. We will have to schedule you for this on a separate date. This test requires authorization from your insurance, and we will take care of the insurance process. We will also request a formal cognitive test called neuropsychological evaluation which is done by a licensed neuropsychologist. We will make a referral in that regard. We will call you with brain scan test results and follow up with you as needed.   You may benefit from getting tested for ADD, you can talk to your psychologist about a referral for this.   I did not see any signs of parkinsons's disease.   I would recommend you reduce your caffeine intake.  I would recommend that you limit yourself to up to 3 servings of caffeine containing beverages per day.    Please stay well-hydrated with water, 6 to 8 cups of water per day are recommended generally speaking for the average adult, 8 ounce size each.

## 2022-05-06 NOTE — Telephone Encounter (Signed)
Referral for Neuropsychology fax to [facility] to see Dr. Izell [place]. Phone: [phone], Fax: [phone]

## 2022-05-10 LAB — RPR: RPR Ser Ql: NONREACTIVE

## 2022-05-10 LAB — SEDIMENTATION RATE: Sed Rate: 2 mm/hr (ref 0–15)

## 2022-05-10 LAB — TSH: TSH: 0.942 u[IU]/mL (ref 0.450–4.500)

## 2022-05-10 LAB — VITAMIN B1: Thiamine: 71.3 nmol/L (ref 66.5–200.0)

## 2022-05-10 LAB — AMMONIA: Ammonia: 22 ug/dL — ABNORMAL LOW (ref 40–200)

## 2022-05-10 LAB — HGB A1C W/O EAG: Hgb A1c MFr Bld: 5.1 % (ref 4.8–5.6)

## 2022-05-10 LAB — VITAMIN D 25 HYDROXY (VIT D DEFICIENCY, FRACTURES): Vit D, 25-Hydroxy: 25.3 ng/mL — ABNORMAL LOW (ref 30.0–100.0)

## 2022-05-10 LAB — B12 AND FOLATE PANEL
Folate: 5 ng/mL (ref >3.0)
Vitamin B-12: 1536 pg/mL — ABNORMAL HIGH (ref 232–1245)

## 2022-05-11 ENCOUNTER — Telehealth: Payer: Self-pay | Admitting: *Deleted

## 2022-05-11 NOTE — Telephone Encounter (Signed)
LVM for patient to call back to go over lab results

## 2022-05-11 NOTE — Telephone Encounter (Signed)
-----   Message from Huston Foley, MD sent at 05/07/2022  4:37 PM EST ----- Please advise patient that his vitamin D level was mildly low, as I recall, he has been supplementing with vitamin D over-the-counter.  He should continue with over-the-counter vitamin D supplementation and follow-up with primary care regarding retesting vitamin D in about 3 months.  2 of the blood test results are pending, we will update if abnormal.  Vitamin B12 is mildly above normal, if he is supplementing with vitamin B12 he can stop for now and have it rechecked by primary care in about 3 months.

## 2022-05-14 NOTE — Telephone Encounter (Signed)
LVM for patient to call back to go over lab results  

## 2022-05-18 ENCOUNTER — Encounter: Payer: Self-pay | Admitting: *Deleted

## 2022-05-18 NOTE — Telephone Encounter (Signed)
Tried to contact patient 3 times on 3 different days  Left VM no call back from patient . Will send unable to contact letter . Sent letter  out in mail today . Forward lab results to patients PCP

## 2022-05-19 NOTE — Telephone Encounter (Signed)
Spoke with patient gave lab work results. Gave Dr Frances Furbish recommendation for vitamin supplementation of Vitamin D and B12  Pt states he doesn't take any supplementation for B12 or vitamin D  Per Dr Frances Furbish Take 1,000-2,000 units of Vitamin D daily,and have  Vitamin D and B12 rechecked in 3 months Pt states does Dr Frances Furbish still  recommends for him to continue with MRI brain and Neuropsychology referrals. Per Dr Frances Furbish highly  recommends continuing with testing to get a complete Diagnosis for patient . Pt states the cost of testing is a issue and he has to discuss with wife to see if he can move on with testing.    Pt Thanked me for calling

## 2022-08-13 DIAGNOSIS — Z1211 Encounter for screening for malignant neoplasm of colon: Secondary | ICD-10-CM | POA: Diagnosis not present

## 2022-08-13 DIAGNOSIS — K648 Other hemorrhoids: Secondary | ICD-10-CM | POA: Diagnosis not present

## 2022-08-13 DIAGNOSIS — D127 Benign neoplasm of rectosigmoid junction: Secondary | ICD-10-CM | POA: Diagnosis not present

## 2022-12-03 ENCOUNTER — Ambulatory Visit
Admission: EM | Admit: 2022-12-03 | Discharge: 2022-12-03 | Disposition: A | Discharge status: Home / Self Care | Payer: BC Managed Care – PPO

## 2022-12-03 ENCOUNTER — Encounter: Payer: Self-pay | Admitting: Emergency Medicine

## 2022-12-03 DIAGNOSIS — K121 Other forms of stomatitis: Secondary | ICD-10-CM | POA: Diagnosis not present

## 2022-12-03 DIAGNOSIS — H9203 Otalgia, bilateral: Secondary | ICD-10-CM

## 2022-12-03 MED ORDER — PREDNISONE 20 MG PO TABS: ORAL_TABLET | ORAL | 0 refills | Status: AC

## 2022-12-03 MED ORDER — TRIAMCINOLONE ACETONIDE 0.1 % MT PSTE: 1.0000 | PASTE | Freq: Two times a day (BID) | OROMUCOSAL | 1 refills | Status: AC

## 2022-12-03 NOTE — ED Triage Notes (Signed)
Patient c/o bilateral ear pain for several days.  Possible ear infection.  Denies taken any OTC pain meds.  Patient burnt the roof of his mouth and has sores on it.  Tried Orajel w/o relief.  Would like to know if there's anything he can try or be prescribed.

## 2022-12-03 NOTE — Discharge Instructions (Addendum)
Instructed patient to take medications as directed with food to completion.  Encouraged increase daily water intake to 64 ounces per day while taking these medications.  Advised if symptoms worsen and/or unresolved please follow-up with PCP or here for further evaluation.

## 2022-12-03 NOTE — ED Provider Notes (Signed)
[facility]    CSN: [id number] Arrival date & time: 12/03/22  0841      History   Chief Complaint Chief Complaint  Patient presents with   Otalgia    HPI Sajed Bridenbaugh is a 47 y.o. male.   HPI Pleasant 47 year old male presents with bilateral ear pain for several days.  Reports is on his way to [facility] and wants to rule out ear infection.  Additionally, patient reports has burn to right upper corner of hard palate secondary to drinking hot tea several days ago. PMH significant for OCD, migraines, and palpitations.  Past Medical History:  Diagnosis Date   Childhood asthma    Heart murmur    Migraines    OCD (obsessive compulsive disorder)    Onychomycosis    bilateral big toes (mild)   Stomach problems     Patient Active Problem List   Diagnosis Date Noted   Palpitations 07/22/2018   Family history of heart disease 03/19/2017    Past Surgical History:  Procedure Laterality Date   broken neck/wrist      2021   KNEE SURGERY  1992       Home Medications    Prior to Admission medications   Medication Sig Start Date End Date Taking? Authorizing Provider  gabapentin (NEURONTIN) 300 MG capsule Take 300 mg by mouth 3 (three) times daily. 09/07/22  Yes [provider]  PARoxetine (PAXIL) 40 MG tablet Take 40 mg by mouth every morning.   Yes [provider]  predniSONE (DELTASONE) 20 MG tablet Take 3 tabs PO daily x 5 days. 12/03/22  Yes Trevor Iha, FNP  rizatriptan (MAXALT) 10 MG tablet Take 10 mg by mouth as needed for migraine. 12/08/19  Yes [provider]  triamcinolone (KENALOG) 0.1 % paste Use as directed 1 Application in the mouth or throat 2 (two) times daily. Advised patient may apply to affected areas of upper mouth/hard palate twice daily for 5 days 12/03/22  Yes Trevor Iha, FNP  ALPRAZolam Prudy Feeler) 0.25 MG tablet Take 0.25 mg by mouth at bedtime as needed for anxiety.    [provider]    Family  History Family History  Problem Relation Age of Onset   Hypertension Mother    Coronary artery disease Father    Hypertension Father    Parkinson's disease Father    Parkinson's disease Maternal Aunt    Parkinson's disease Paternal Aunt    Coronary artery disease Paternal Grandfather     Social History Social History   Tobacco Use   Smoking status: Former    Types: Cigarettes   Smokeless tobacco: Current  Vaping Use   Vaping Use: Never used  Substance Use Topics   Alcohol use: Not Currently     Allergies   Patient has no known allergies.   Review of Systems Review of Systems  HENT:  Positive for ear pain.   All other systems reviewed and are negative.    Physical Exam Triage Vital Signs ED Triage Vitals  Enc Vitals Group     BP      Pulse      Resp      Temp      Temp src      SpO2      Weight      Height      Head Circumference      Peak Flow      Pain Score      Pain Loc  Pain Edu?      Excl. in GC?    No data found.  Updated Vital Signs BP 113/76 (BP Location: Right Arm)   Pulse 74   Temp 98.2 F (36.8 C) (Oral)   Resp 16   Ht 6' (1.829 m)   Wt 152 lb (68.9 kg)   SpO2 100%   BMI 20.61 kg/m        Physical Exam Vitals and nursing note reviewed.  Constitutional:      Appearance: Normal appearance. He is normal weight.  HENT:     Head: Normocephalic and atraumatic.     Right Ear: External ear normal.     Left Ear: External ear normal.     Mouth/Throat:     Mouth: Mucous membranes are moist.     Pharynx: Oropharynx is clear.     Comments: Upper maxillary (upper left corner):~1.0 cm square shaped erythematous oral avulsion/oral ulceration, painful per patient Eyes:     Extraocular Movements: Extraocular movements intact.     Conjunctiva/sclera: Conjunctivae normal.     Pupils: Pupils are equal, round, and reactive to light.  Cardiovascular:     Rate and Rhythm: Normal rate and regular rhythm.     Pulses: Normal pulses.      Heart sounds: Normal heart sounds.  Pulmonary:     Effort: Pulmonary effort is normal.     Breath sounds: Normal breath sounds. No wheezing, rhonchi or rales.  Musculoskeletal:     Cervical back: Normal range of motion and neck supple.  Skin:    General: Skin is warm and dry.  Neurological:     General: No focal deficit present.     Mental Status: He is alert and oriented to person, place, and time. Mental status is at baseline.      UC Treatments / Results  Labs (all labs ordered are listed, but only abnormal results are displayed) Labs Reviewed - No data to display  EKG   Radiology No results found.  Procedures Procedures (including critical care time)  Medications Ordered in UC Medications - No data to display  Initial Impression / Assessment and Plan / UC Course  I have reviewed the triage vital signs and the nursing notes.  Pertinent labs & imaging results that were available during my care of the patient were reviewed by me and considered in my medical decision making (see chart for details).     MDM: 1.  Oral ulceration-Rx'd Triamcinolone Kenalog 0.1% paste: Apply to affected area twice daily for the next 5 days; 2.  Otalgia of both ears-Rx'd prednisone 60 mg tablet twice daily x 5 days. Instructed patient to take medications as directed with food to completion.  Encouraged increase daily water intake to 64 ounces per day while taking these medications.  Advised if symptoms worsen and/or unresolved please follow-up with PCP or here for further evaluation.  Patient discharged home, hemodynamically stable.  Final Clinical Impressions(s) / UC Diagnoses   Final diagnoses:  Otalgia of both ears  Oral ulceration     Discharge Instructions      Instructed patient to take medications as directed with food to completion.  Encouraged increase daily water intake to 64 ounces per day while taking these medications.  Advised if symptoms worsen and/or unresolved please  follow-up with PCP or here for further evaluation.     ED Prescriptions     Medication Sig Dispense Auth. Provider   triamcinolone (KENALOG) 0.1 % paste Use as directed 1 Application  in the mouth or throat 2 (two) times daily. Advised patient may apply to affected areas of upper mouth/hard palate twice daily for 5 days 5 g Trevor Iha, FNP   predniSONE (DELTASONE) 20 MG tablet Take 3 tabs PO daily x 5 days. 15 tablet Trevor Iha, FNP      PDMP not reviewed this encounter.   Trevor Iha, FNP 12/03/22 1014

## 2023-02-25 DIAGNOSIS — Z0000 Encounter for general adult medical examination without abnormal findings: Secondary | ICD-10-CM | POA: Diagnosis not present

## 2023-02-25 DIAGNOSIS — E782 Mixed hyperlipidemia: Secondary | ICD-10-CM | POA: Diagnosis not present

## 2023-02-25 DIAGNOSIS — F422 Mixed obsessional thoughts and acts: Secondary | ICD-10-CM | POA: Diagnosis not present

## 2023-02-25 DIAGNOSIS — G43109 Migraine with aura, not intractable, without status migrainosus: Secondary | ICD-10-CM | POA: Diagnosis not present

## 2023-02-25 DIAGNOSIS — R002 Palpitations: Secondary | ICD-10-CM | POA: Diagnosis not present

## 2023-02-26 ENCOUNTER — Other Ambulatory Visit (HOSPITAL_BASED_OUTPATIENT_CLINIC_OR_DEPARTMENT_OTHER): Payer: Self-pay | Admitting: Family Medicine

## 2023-02-26 DIAGNOSIS — E782 Mixed hyperlipidemia: Secondary | ICD-10-CM

## 2023-03-15 ENCOUNTER — Ambulatory Visit (HOSPITAL_BASED_OUTPATIENT_CLINIC_OR_DEPARTMENT_OTHER)
Admission: RE | Admit: 2023-03-15 | Discharge: 2023-03-15 | Disposition: A | Discharge status: Home / Self Care | Payer: BC Managed Care – PPO | Source: Ambulatory Visit | Attending: Family Medicine | Admitting: Family Medicine

## 2023-03-15 DIAGNOSIS — E782 Mixed hyperlipidemia: Secondary | ICD-10-CM | POA: Insufficient documentation

## 2023-05-21 DIAGNOSIS — H6692 Otitis media, unspecified, left ear: Secondary | ICD-10-CM | POA: Diagnosis not present

## 2023-06-11 DIAGNOSIS — H6593 Unspecified nonsuppurative otitis media, bilateral: Secondary | ICD-10-CM | POA: Diagnosis not present

## 2023-06-25 DIAGNOSIS — H93293 Other abnormal auditory perceptions, bilateral: Secondary | ICD-10-CM | POA: Diagnosis not present

## 2023-06-25 DIAGNOSIS — H9313 Tinnitus, bilateral: Secondary | ICD-10-CM | POA: Diagnosis not present

## 2023-06-25 DIAGNOSIS — M26622 Arthralgia of left temporomandibular joint: Secondary | ICD-10-CM | POA: Diagnosis not present

## 2023-06-25 DIAGNOSIS — H9202 Otalgia, left ear: Secondary | ICD-10-CM | POA: Diagnosis not present

## 2023-09-03 DIAGNOSIS — R21 Rash and other nonspecific skin eruption: Secondary | ICD-10-CM | POA: Diagnosis not present

## 2023-09-03 DIAGNOSIS — L237 Allergic contact dermatitis due to plants, except food: Secondary | ICD-10-CM | POA: Diagnosis not present

## 2024-03-17 DIAGNOSIS — F439 Reaction to severe stress, unspecified: Secondary | ICD-10-CM | POA: Diagnosis not present

## 2024-03-17 DIAGNOSIS — Z131 Encounter for screening for diabetes mellitus: Secondary | ICD-10-CM | POA: Diagnosis not present

## 2024-03-17 DIAGNOSIS — Z0000 Encounter for general adult medical examination without abnormal findings: Secondary | ICD-10-CM | POA: Diagnosis not present

## 2024-03-17 DIAGNOSIS — F422 Mixed obsessional thoughts and acts: Secondary | ICD-10-CM | POA: Diagnosis not present

## 2024-03-17 DIAGNOSIS — G43109 Migraine with aura, not intractable, without status migrainosus: Secondary | ICD-10-CM | POA: Diagnosis not present

## 2024-03-17 DIAGNOSIS — E782 Mixed hyperlipidemia: Secondary | ICD-10-CM | POA: Diagnosis not present

## 2024-03-17 DIAGNOSIS — Z13220 Encounter for screening for lipoid disorders: Secondary | ICD-10-CM | POA: Diagnosis not present
